# Patient Record
Sex: Male | Born: 1975 | Race: White | Hispanic: No | Marital: Married | Smoking: Current every day smoker
Health system: Southern US, Community
[De-identification: ages and names within clinical notes are randomized; demographics above are authoritative.]

## PROBLEM LIST (undated history)

## (undated) HISTORY — PX: EYE SURGERY: SHX253

## (undated) HISTORY — PX: KNEE SURGERY: SHX244

## (undated) HISTORY — PX: APPENDECTOMY: SHX54

---

## 2010-05-24 ENCOUNTER — Emergency Department (HOSPITAL_COMMUNITY): Admission: EM | Admit: 2010-05-24 | Discharge: 2010-05-24 | Payer: Self-pay | Admitting: Emergency Medicine

## 2010-10-02 ENCOUNTER — Emergency Department (HOSPITAL_COMMUNITY)
Admission: EM | Admit: 2010-10-02 | Discharge: 2010-10-02 | Disposition: A | Discharge status: Home / Self Care | Payer: Self-pay | Attending: Emergency Medicine | Admitting: Emergency Medicine

## 2010-10-02 DIAGNOSIS — K089 Disorder of teeth and supporting structures, unspecified: Secondary | ICD-10-CM | POA: Insufficient documentation

## 2010-10-20 ENCOUNTER — Emergency Department (HOSPITAL_COMMUNITY)
Admission: EM | Admit: 2010-10-20 | Discharge: 2010-10-20 | Disposition: A | Discharge status: Home / Self Care | Payer: Self-pay | Attending: Emergency Medicine | Admitting: Emergency Medicine

## 2010-10-20 DIAGNOSIS — K089 Disorder of teeth and supporting structures, unspecified: Secondary | ICD-10-CM | POA: Insufficient documentation

## 2011-08-06 ENCOUNTER — Emergency Department (HOSPITAL_COMMUNITY)
Admission: EM | Admit: 2011-08-06 | Discharge: 2011-08-07 | Disposition: A | Discharge status: Home / Self Care | Payer: Self-pay | Attending: Emergency Medicine | Admitting: Emergency Medicine

## 2011-08-06 DIAGNOSIS — X58XXXA Exposure to other specified factors, initial encounter: Secondary | ICD-10-CM | POA: Insufficient documentation

## 2011-08-06 DIAGNOSIS — S025XXA Fracture of tooth (traumatic), initial encounter for closed fracture: Secondary | ICD-10-CM | POA: Insufficient documentation

## 2011-08-06 DIAGNOSIS — K089 Disorder of teeth and supporting structures, unspecified: Secondary | ICD-10-CM | POA: Insufficient documentation

## 2011-08-06 DIAGNOSIS — K0889 Other specified disorders of teeth and supporting structures: Secondary | ICD-10-CM

## 2011-08-06 DIAGNOSIS — F17200 Nicotine dependence, unspecified, uncomplicated: Secondary | ICD-10-CM | POA: Insufficient documentation

## 2011-08-06 MED ORDER — HYDROCODONE-ACETAMINOPHEN 5-325 MG PO TABS
2.0000 | ORAL_TABLET | Freq: Once | ORAL | Status: AC
  Administered 2011-08-06: 2 via ORAL
  Filled 2011-08-06: qty 2

## 2011-08-06 MED ORDER — IBUPROFEN 800 MG PO TABS
800.0000 mg | ORAL_TABLET | Freq: Once | ORAL | Status: AC
  Administered 2011-08-06: 800 mg via ORAL
  Filled 2011-08-06: qty 1

## 2011-08-06 MED ORDER — PENICILLIN V POTASSIUM 250 MG PO TABS
500.0000 mg | ORAL_TABLET | Freq: Once | ORAL | Status: AC
  Administered 2011-08-06: 500 mg via ORAL
  Filled 2011-08-06: qty 2

## 2011-08-06 MED ORDER — HYDROCODONE-ACETAMINOPHEN 5-325 MG PO TABS: 1.0000 | ORAL_TABLET | ORAL | Status: AC | PRN

## 2011-08-06 MED ORDER — IBUPROFEN 800 MG PO TABS: 800.0000 mg | ORAL_TABLET | Freq: Three times a day (TID) | ORAL | Status: AC

## 2011-08-06 MED ORDER — PENICILLIN V POTASSIUM 500 MG PO TABS: 500.0000 mg | ORAL_TABLET | Freq: Four times a day (QID) | ORAL | Status: AC

## 2011-08-06 NOTE — ED Notes (Signed)
Pt reports having dental pain with his front teeth and left side of his jaw. Pt reports having some chipped teeth. Pt states that it hurts for him to talk, eat or drink anything. No swelling noted. Pt reports pain 8/10 at this time.

## 2011-08-06 NOTE — ED Provider Notes (Addendum)
History     CSN: [id number]  Arrival date & time 08/06/11  2250   First MD Initiated Contact with Patient 08/06/11 2313      Chief Complaint  Patient presents with  . Dental Pain    (Consider location/radiation/quality/duration/timing/severity/associated sxs/prior treatment) HPI Comments: Timothy Dougherty is a 35 y.o. male who presents to the Emergency Department complaining of dental pain which started today in teeth on the upper front of his mouth and the lower left. Teeth are broken at the gumline and have been for months. Today began hurting. He has taken tylenol without relief. He has an appointment with his dentist on 08/13/11. Denies fever, chills, difficulty swallowing.  Patient is a 35 y.o. male presenting with tooth pain.  Dental Pain   History reviewed. No pertinent past medical history.  Past Surgical History  Procedure Date  . Appendectomy     No family history on file.  History  Substance Use Topics  . Smoking status: Current Everyday Smoker    Types: Cigarettes  . Smokeless tobacco: Not on file  . Alcohol Use: No      Review of Systems  All other systems reviewed and are negative.    Allergies  Review of patient's allergies indicates no known allergies.  Home Medications  No current outpatient prescriptions on file.  BP 159/84  Pulse 98  Temp(Src) 98.6 F (37 C) (Oral)  Resp 22  Ht 6\' 3"  (1.905 m)  Wt 230 lb (104.327 kg)  BMI 28.75 kg/m2  SpO2 99%  Physical Exam  Nursing note and vitals reviewed. Constitutional: He is oriented to person, place, and time. He appears well-developed and well-nourished. No distress.  HENT:  Head: Normocephalic and atraumatic.  Right Ear: External ear normal.  Left Ear: External ear normal.  Mouth/Throat: Oropharynx is clear and moist.       Poor dental hygiene, poor dentition. Broken teeth 4 front on upper jaw, 1st and 2nd molar on bottom left. No obvious abscess, no facial swelling, no drainage.    Eyes: EOM are normal.  Cardiovascular: Normal rate, normal heart sounds and intact distal pulses.   Pulmonary/Chest: Breath sounds normal.  Musculoskeletal: Normal range of motion.  Neurological: He is alert and oriented to person, place, and time.  Skin: Skin is warm and dry.    ED Course  Procedures (including critical care time)     MDM  Patient with poor dentition here with dental pain. Given antibiotic, antiinflammatory, analgesic. Patient has follow up with his dentist on 08/13/11.Pt stable in ED with no significant deterioration in condition.The patient appears reasonably screened and/or stabilized for discharge and I doubt any other medical condition or other [facility] requiring further screening, evaluation, or treatment in the ED at this time prior to discharge.  MDM Reviewed: previous chart, nursing note and vitals           Nicoletta Dress. Colon Branch, MD 08/06/11 2346  Nicoletta Dress. Colon Branch, MD 08/07/11 0001

## 2011-08-06 NOTE — ED Notes (Signed)
Toothache that started today, top front

## 2013-12-11 ENCOUNTER — Emergency Department: Payer: Self-pay | Admitting: Emergency Medicine

## 2013-12-26 ENCOUNTER — Emergency Department: Payer: Self-pay | Admitting: Emergency Medicine

## 2015-01-11 IMAGING — CR DG HAND COMPLETE 3+V*L*
1 series · 3 of 3 positions shown · non-contrast
Comparison: None.

CLINICAL DATA: Left thumb pain after injury.

EXAM:
LEFT HAND - COMPLETE 3+ VIEW

[Series 1: x hand pa left · 0.14mm/px · 3 of 3 slices shown]
[im 1/3]
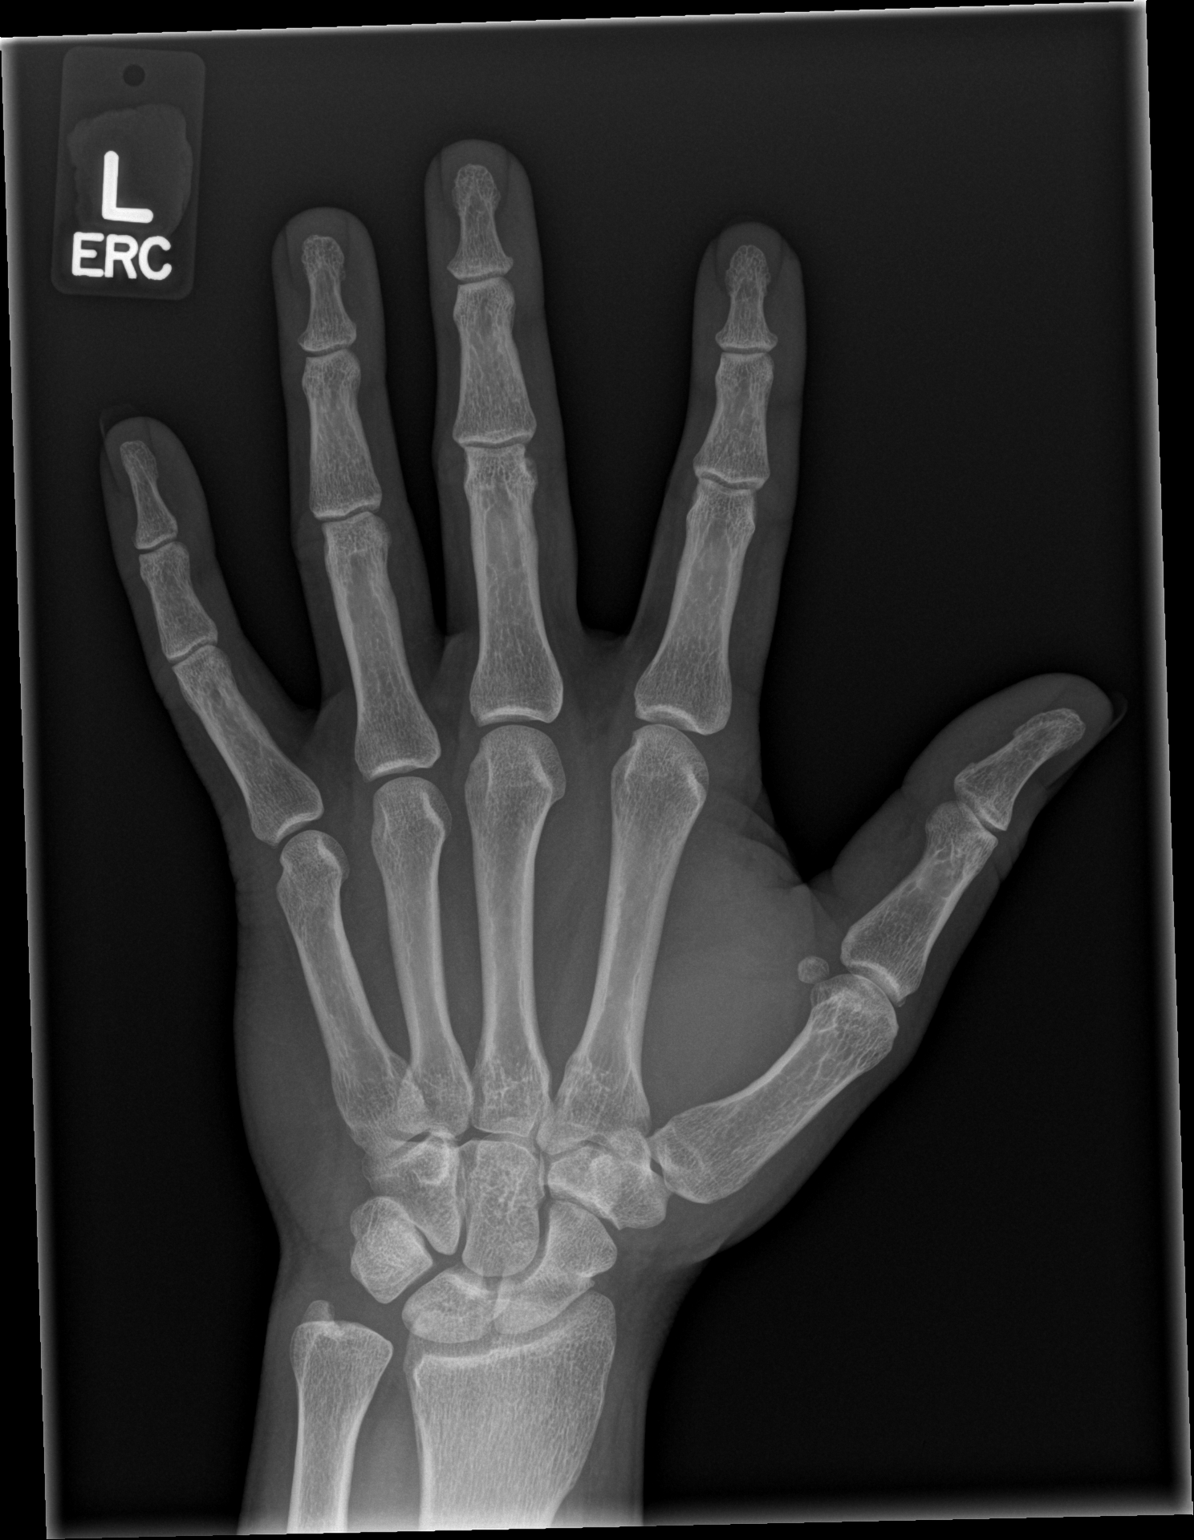
[im 2/3]
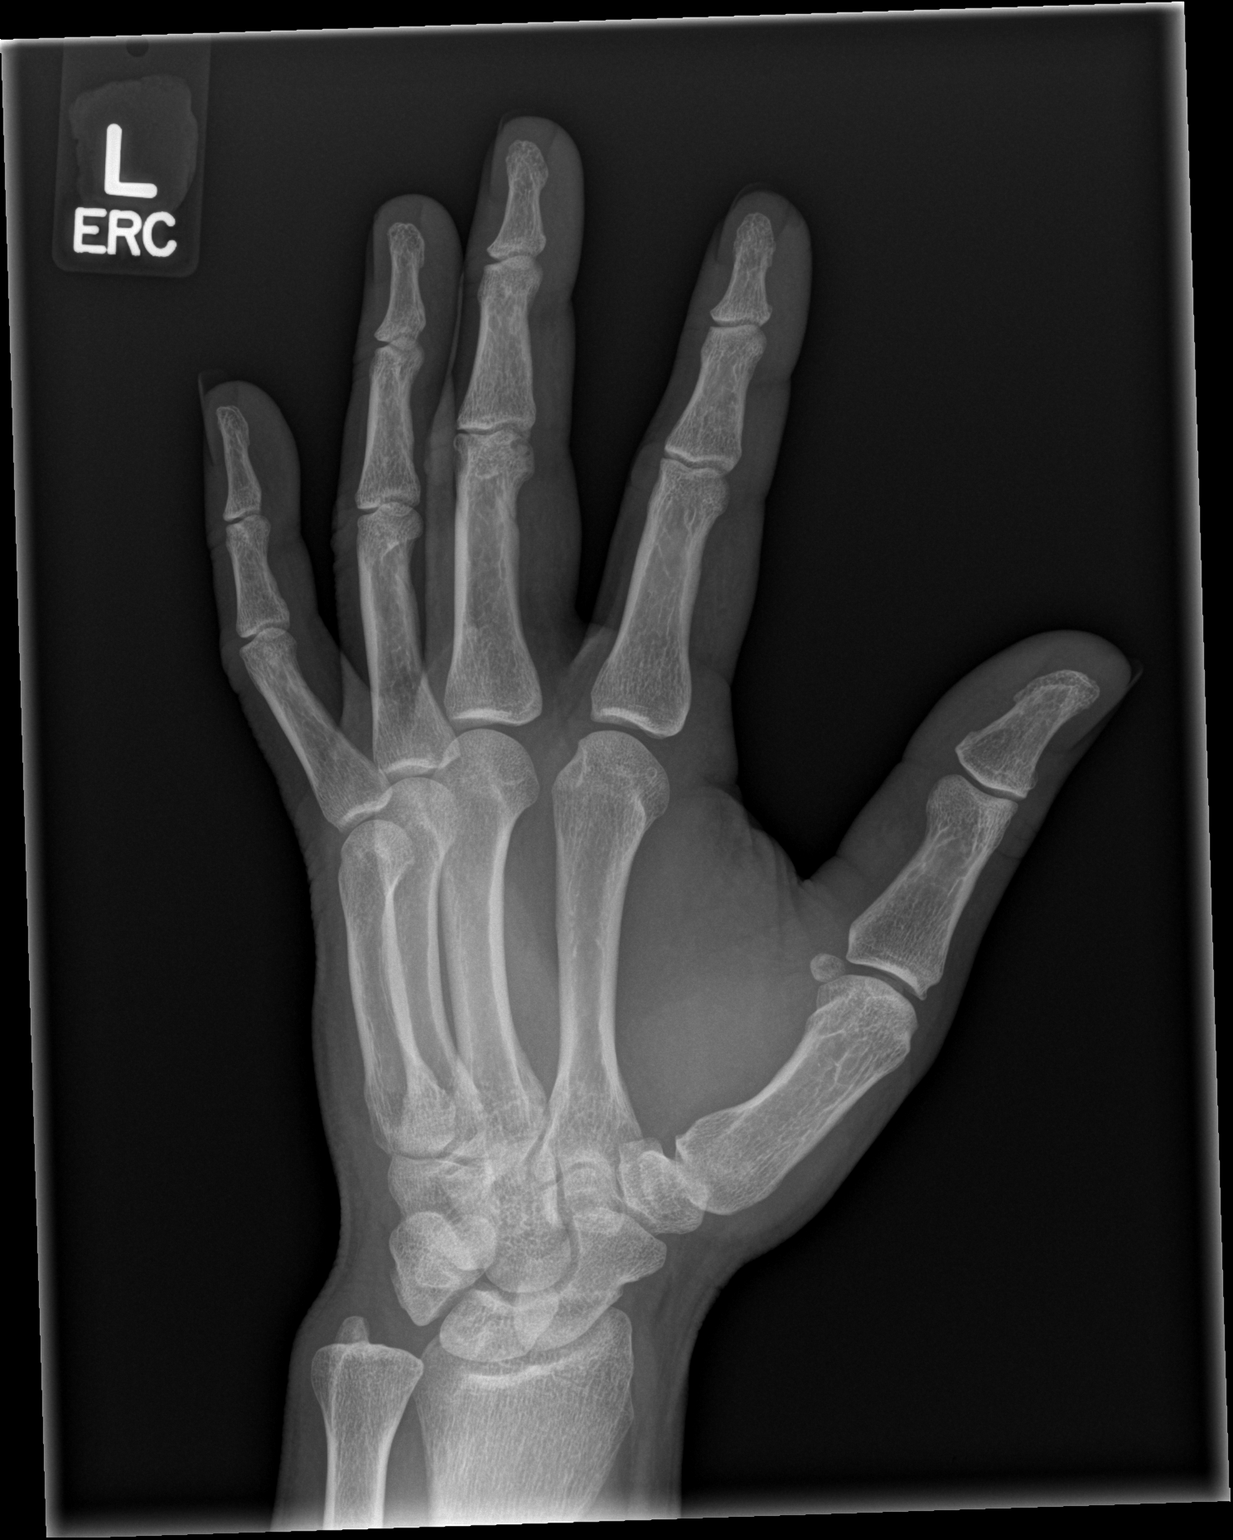
[im 3/3]
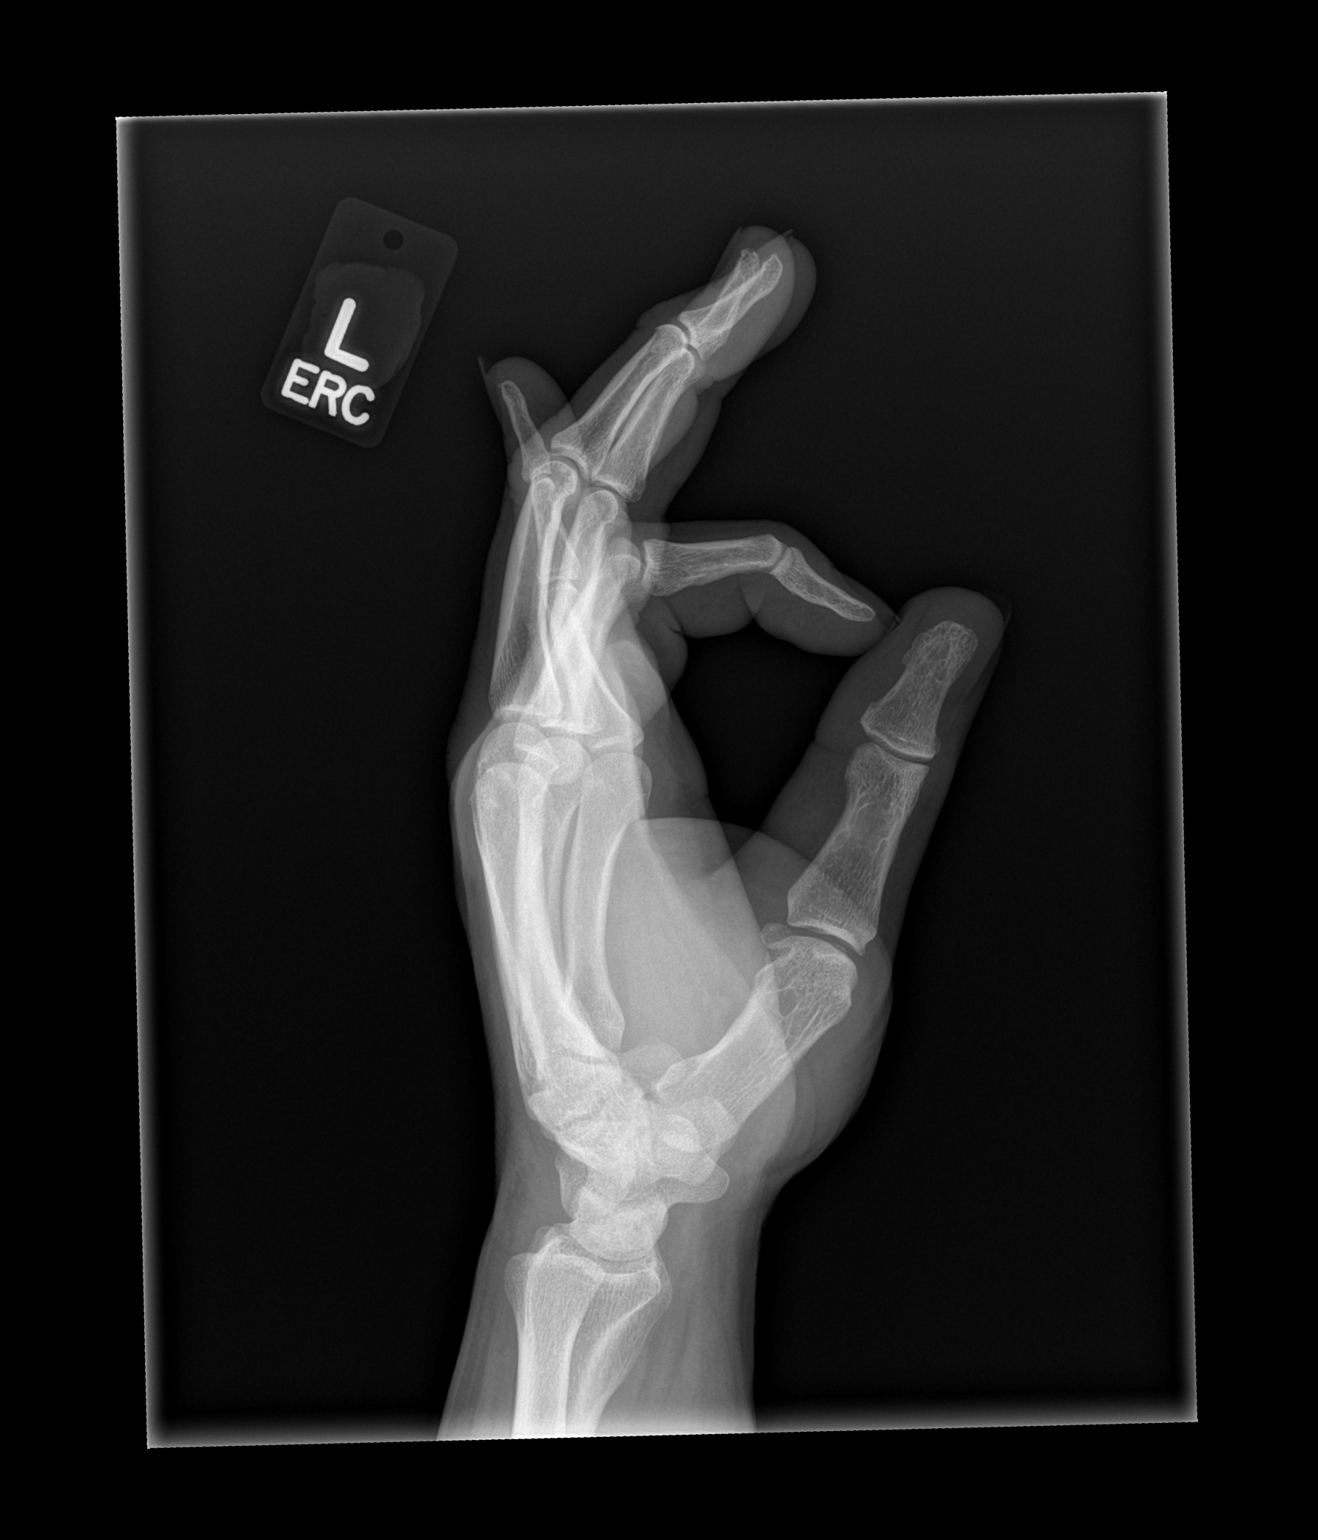

[3 of 3 positions shown; findings below may reference images not displayed]

FINDINGS: There is no evidence of fracture or dislocation. The thumb is
unremarkable in appearance. The joint spaces are preserved; the soft
tissues are unremarkable in appearance. The carpal rows are intact,
and demonstrate normal alignment.
IMPRESSION: No evidence of fracture or dislocation.

## 2015-01-26 IMAGING — CR LEFT WRIST - COMPLETE 3+ VIEW
1 series · 4 of 4 positions shown · non-contrast
Comparison: DG HAND COMPLETE 3+V*L* dated 12/11/2013

CLINICAL DATA: Pain between the first and second digits and radial
side of the wrist after injury. Limited range of motion.

EXAM:
LEFT WRIST - COMPLETE 3+ VIEW

[Series 1: pa · 0.17mm/px · 4 of 4 slices shown]
[im 1/4]
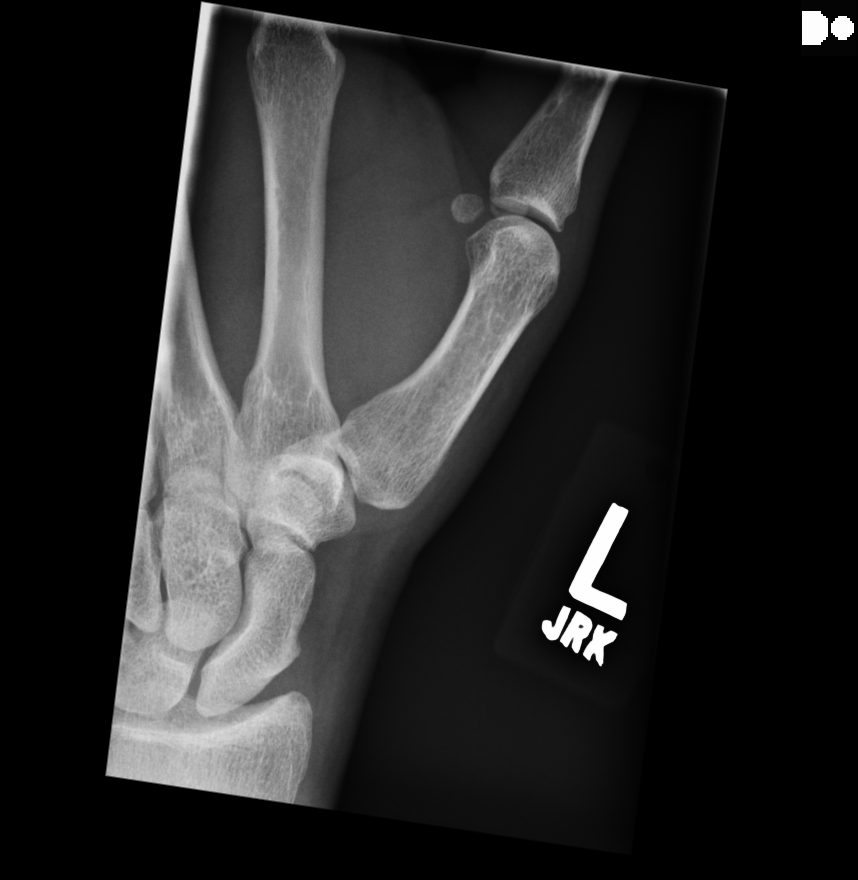
[im 2/4]
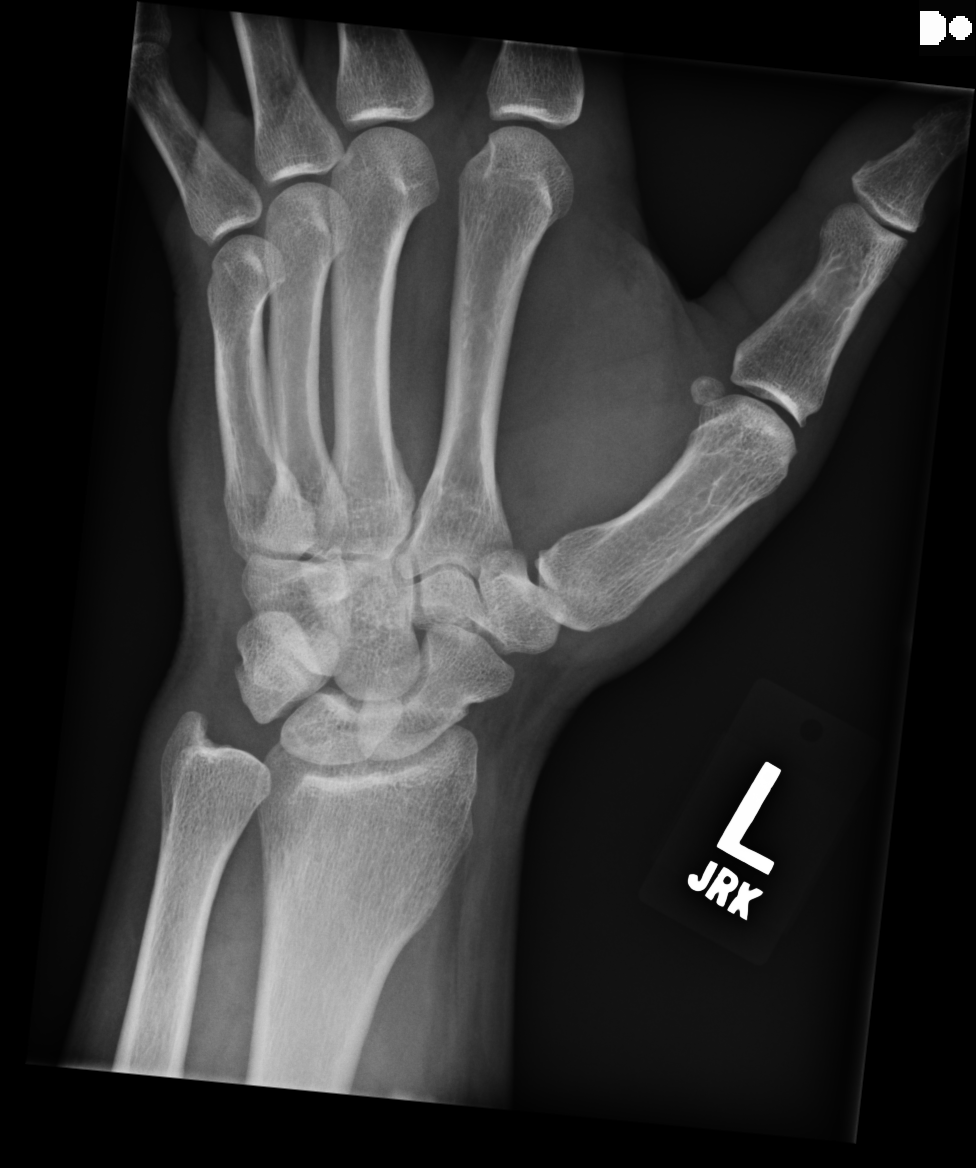
[im 3/4]
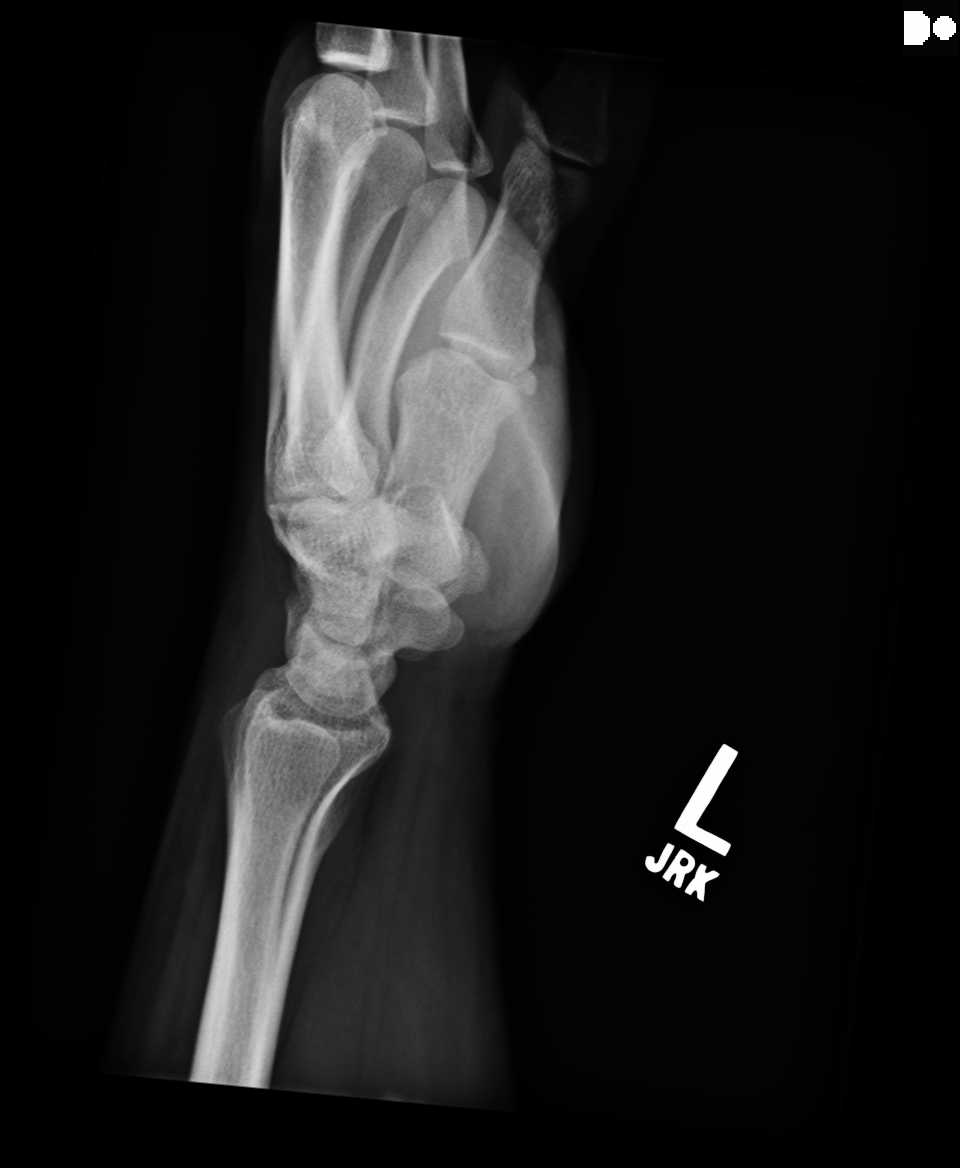
[im 4/4]
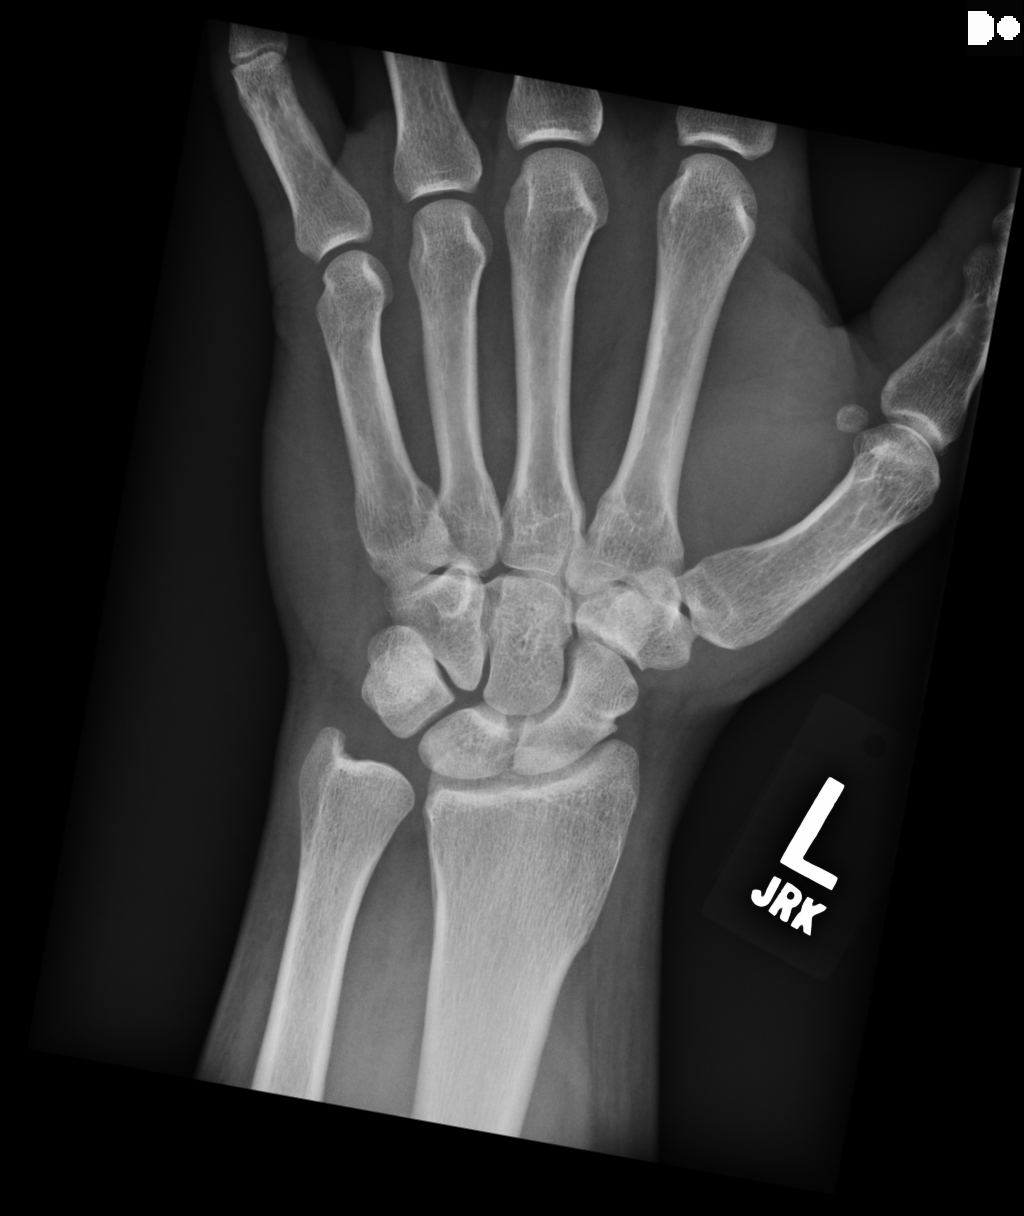

[4 of 4 positions shown; findings below may reference images not displayed]

FINDINGS: There is no evidence of fracture or dislocation. There is no
evidence of arthropathy or other focal bone abnormality. Soft
tissues are unremarkable.
IMPRESSION: Negative.

## 2015-12-31 ENCOUNTER — Encounter: Payer: Self-pay | Admitting: Emergency Medicine

## 2015-12-31 ENCOUNTER — Emergency Department
Admission: EM | Admit: 2015-12-31 | Discharge: 2015-12-31 | Disposition: A | Discharge status: Home / Self Care | Payer: Self-pay | Attending: Emergency Medicine | Admitting: Emergency Medicine

## 2015-12-31 DIAGNOSIS — F17210 Nicotine dependence, cigarettes, uncomplicated: Secondary | ICD-10-CM | POA: Insufficient documentation

## 2015-12-31 DIAGNOSIS — L0390 Cellulitis, unspecified: Secondary | ICD-10-CM

## 2015-12-31 DIAGNOSIS — L02416 Cutaneous abscess of left lower limb: Secondary | ICD-10-CM | POA: Insufficient documentation

## 2015-12-31 DIAGNOSIS — L0291 Cutaneous abscess, unspecified: Secondary | ICD-10-CM

## 2015-12-31 MED ORDER — CLINDAMYCIN HCL 150 MG PO CAPS
300.0000 mg | ORAL_CAPSULE | Freq: Once | ORAL | Status: AC
  Administered 2015-12-31: 300 mg via ORAL
  Filled 2015-12-31: qty 2

## 2015-12-31 MED ORDER — OXYCODONE-ACETAMINOPHEN 5-325 MG PO TABS
2.0000 | ORAL_TABLET | Freq: Once | ORAL | Status: AC
  Administered 2015-12-31: 2 via ORAL
  Filled 2015-12-31: qty 2

## 2015-12-31 MED ORDER — LIDOCAINE HCL (PF) 1 % IJ SOLN
5.0000 mL | Freq: Once | INTRAMUSCULAR | Status: AC
  Administered 2015-12-31: 5 mL via INTRADERMAL
  Filled 2015-12-31: qty 5

## 2015-12-31 MED ORDER — OXYCODONE-ACETAMINOPHEN 5-325 MG PO TABS: 1.0000 | ORAL_TABLET | Freq: Four times a day (QID) | ORAL | Status: AC | PRN

## 2015-12-31 MED ORDER — CLINDAMYCIN HCL 300 MG PO CAPS: 300.0000 mg | ORAL_CAPSULE | Freq: Three times a day (TID) | ORAL | Status: AC

## 2015-12-31 NOTE — ED Notes (Signed)
Patient ambulatory to triage with steady gait, without difficulty or distress noted; pt reports abscess to left inner thigh noted this morning

## 2015-12-31 NOTE — ED Provider Notes (Signed)
[facility] Emergency Department Provider Note   ____________________________________________  Time seen: Approximately 436 AM  I have reviewed the triage vital signs and the nursing notes.   HISTORY  Chief Complaint Abscess    HPI Timothy Dougherty is a 40 y.o. male who comes into the hospital today with a lump to his left upper thigh. The patient reports that it was a small bump yesterday but is significantly increased in size in the last 24 hours. The patient reports that it is painful ever since his it has gotten bigger. He's never had this of this size previously. He has been trying to deal with it and not taking any medications. The area has not been draining. The patient denies fevers but rates his pain a 6-7 out of 10 in intensity. He's had no nausea or vomiting, no chest pain. He tried warm compresses and sitting in a hot bath but it did not help the area to drain. The patient is here for evaluation and treatment of his abscess.   History reviewed. No pertinent past medical history.  There are no active problems to display for this patient.   Past Surgical History  Procedure Laterality Date  . Appendectomy      Current Outpatient Rx  Name  Route  Sig  Dispense  Refill  . clindamycin (CLEOCIN) 300 MG capsule   Oral   Take 1 capsule (300 mg total) by mouth 3 (three) times daily.   30 capsule   0   . oxyCODONE-acetaminophen (ROXICET) 5-325 MG tablet   Oral   Take 1 tablet by mouth every 6 (six) hours as needed.   12 tablet   0     Allergies Review of patient's allergies indicates no known allergies.  No family history on file.  Social History Social History  Substance Use Topics  . Smoking status: Current Every Day Smoker    Types: Cigarettes  . Smokeless tobacco: None  . Alcohol Use: No    Review of Systems Constitutional: No fever/chills Eyes: No visual changes. ENT: No sore throat. Cardiovascular: Denies chest  pain. Respiratory: Denies shortness of breath. Gastrointestinal: No abdominal pain.  No nausea, no vomiting.  No diarrhea.  No constipation. Genitourinary: Negative for dysuria. Musculoskeletal: Negative for back pain. Skin: Left inner thigh abscess Neurological: Negative for headaches, focal weakness or numbness.  10-point ROS otherwise negative.  ____________________________________________   PHYSICAL EXAM:  VITAL SIGNS: ED Triage Vitals  Enc Vitals Group     BP 12/31/15 0028 132/85 mmHg     Pulse Rate 12/31/15 0028 95     Resp 12/31/15 0028 20     Temp 12/31/15 0028 98.2 F (36.8 C)     Temp Source 12/31/15 0028 Oral     SpO2 12/31/15 0028 97 %     Weight 12/31/15 0028 295 lb (133.811 kg)     Height 12/31/15 0028  (1.905 m)     Head Cir --      Peak Flow --      Pain Score 12/31/15 0028 6     Pain Loc --      Pain Edu? --      Excl. in GC? --     Constitutional: Alert and oriented. Well appearing and in Mild distress. Eyes: Conjunctivae are normal. PERRL. EOMI. Head: Atraumatic. Nose: No congestion/rhinnorhea. Mouth/Throat: Mucous membranes are moist.  Oropharynx non-erythematous. Cardiovascular: Normal rate, regular rhythm. Grossly normal heart sounds.  Good peripheral circulation. Respiratory:  Normal respiratory effort.  No retractions. Lungs CTAB. Gastrointestinal: Soft and nontender. No distention. . Musculoskeletal: No lower extremity tenderness nor edema.  Neurologic:  Normal speech and language.  Skin:  Abscess with fluctuance, induration and erythema to left upper inner thigh. Psychiatric: Mood and affect are normal.   ____________________________________________   LABS (all labs ordered are listed, but only abnormal results are displayed)  Labs Reviewed - No data to  display ____________________________________________  EKG  none ____________________________________________  RADIOLOGY  none ____________________________________________   PROCEDURES  Procedure(s) performed: please, see procedure note(s).   INCISION AND DRAINAGE Performed by: Lucrezia EuropeWebster,  Pedram Goodchild P Consent: Verbal consent obtained. Risks and benefits: risks, benefits and alternatives were discussed Type: abscess  Body area: Left inner thigh  Anesthesia: local infiltration  Incision was made with a scalpel.  Local anesthetic: lidocaine 1% without epinephrine  Anesthetic total: 3 ml  Complexity: complex Blunt dissection to break up loculations  Drainage: bloody  Drainage amount: moderate  Packing material: 1/4 in iodoform gauze  Patient tolerance: Patient tolerated the procedure well with no immediate complications.     Critical Care performed: No  ____________________________________________   INITIAL IMPRESSION / ASSESSMENT AND PLAN / ED COURSE  Pertinent labs & imaging results that were available during my care of the patient were reviewed by me and considered in my medical decision making (see chart for details).  This is a 40 year old male who comes into the hospital today with an abscess to his left inner thigh. The patient is complaining of some significant pain. I did give the patient a dose of Percocet and I did incise and drain the area. There was initially some clear appearing fluid within some bloody drainage afterwards. I was able to remove significant amounts of fluid from the patient's left thigh. I did improve for loculations and packed area. The patient was having some improvement in the pressure and discomfort he had been having previously. The patient will be discharged to home with some clindamycin which he received here in the ER as well as pain medication. ____________________________________________   FINAL CLINICAL IMPRESSION(S) / ED  DIAGNOSES  Final diagnoses:  Abscess and cellulitis      NEW MEDICATIONS STARTED DURING THIS VISIT:  Discharge Medication List as of 12/31/2015  5:54 AM    START taking these medications   Details  clindamycin (CLEOCIN) 300 MG capsule Take 1 capsule (300 mg total) by mouth 3 (three) times daily., Starting 12/31/2015, Until Discontinued, Print    oxyCODONE-acetaminophen (ROXICET) 5-325 MG tablet Take 1 tablet by mouth every 6 (six) hours as needed., Starting 12/31/2015, Until Discontinued, Print         Note:  This document was prepared using Dragon voice recognition software and may include unintentional dictation errors.    Rebecka ApleyAllison P Kimarie Coor, MD 12/31/15 [phone]

## 2015-12-31 NOTE — ED Notes (Signed)
PT REPORTS HE HAS ABSCESS ON LEFT UPPER INNER THIGH - ABSCESS HAS INCREASED IN SIZE OVER THE LAST 24 HOURS AND INCREASED IN PAIN LEVEL - PT DENIES DRAINAGE

## 2015-12-31 NOTE — Discharge Instructions (Signed)

## 2016-02-28 ENCOUNTER — Emergency Department: Payer: Self-pay

## 2016-02-28 ENCOUNTER — Emergency Department
Admission: EM | Admit: 2016-02-28 | Discharge: 2016-02-28 | Disposition: A | Discharge status: Home / Self Care | Payer: Self-pay | Attending: Emergency Medicine | Admitting: Emergency Medicine

## 2016-02-28 ENCOUNTER — Encounter: Payer: Self-pay | Admitting: Urgent Care

## 2016-02-28 DIAGNOSIS — S39012A Strain of muscle, fascia and tendon of lower back, initial encounter: Secondary | ICD-10-CM | POA: Insufficient documentation

## 2016-02-28 DIAGNOSIS — Y939 Activity, unspecified: Secondary | ICD-10-CM | POA: Insufficient documentation

## 2016-02-28 DIAGNOSIS — S63614A Unspecified sprain of right ring finger, initial encounter: Secondary | ICD-10-CM | POA: Insufficient documentation

## 2016-02-28 DIAGNOSIS — F17210 Nicotine dependence, cigarettes, uncomplicated: Secondary | ICD-10-CM | POA: Insufficient documentation

## 2016-02-28 DIAGNOSIS — X501XXA Overexertion from prolonged static or awkward postures, initial encounter: Secondary | ICD-10-CM | POA: Insufficient documentation

## 2016-02-28 DIAGNOSIS — Y999 Unspecified external cause status: Secondary | ICD-10-CM | POA: Insufficient documentation

## 2016-02-28 DIAGNOSIS — Y929 Unspecified place or not applicable: Secondary | ICD-10-CM | POA: Insufficient documentation

## 2016-02-28 MED ORDER — HYDROCODONE-ACETAMINOPHEN 5-325 MG PO TABS: 1.0000 | ORAL_TABLET | ORAL | Status: AC | PRN

## 2016-02-28 MED ORDER — HYDROCODONE-ACETAMINOPHEN 5-325 MG PO TABS
2.0000 | ORAL_TABLET | Freq: Once | ORAL | Status: AC
  Administered 2016-02-28: 2 via ORAL
  Filled 2016-02-28: qty 2

## 2016-02-28 NOTE — ED Notes (Signed)

## 2016-02-28 NOTE — ED Notes (Signed)
Patient presents with c/o lower back pain and RIGHT hand pain s/p moving a refrigerator. Patient reports limited and painful ROM in RIGHT hand; mostly over 4th digit and wrist.

## 2016-02-28 NOTE — ED Provider Notes (Signed)
[facility] Emergency Department Provider Note  ____________________________________________  Time seen: Approximately 2:52 AM  I have reviewed the triage vital signs and the nursing notes.   HISTORY  Chief Complaint Back Pain and Hand Injury    HPI Timothy Dougherty is a [age] y.o. male with no significant past medical history who presents for evaluation of acute onset sharp stabbing severe pain in his lower back and in his right hand, specifically the right ring finger.  He was moving a refrigerator when the person that was helping him dropped his side and it created a heavy stress on the patient's back and hand.  Of note he is left-hand dominant.  The patient reports that he felt a popping sensation in his right ring finger and that the pain is radiating from the finger into his hand.  Similarly he felt a popping sensation in his lower back and has some pain in his or spine area.  He walks with a limp.  He has no decreased sensation, no weakness, no numbness nor tingling, no urinary retention, no incontinence of urine.  He denies fever/chills, chest pain, shortness of breath, nausea, vomiting, abdominal pain.  Movement makes the pain in both sides worse and holding still makes it better.   History reviewed. No pertinent past medical history.  There are no active problems to display for this patient.   Past Surgical History  Procedure Laterality Date  . Appendectomy    . Eye surgery    . Knee surgery Left     Current Outpatient Rx  Name  Route  Sig  Dispense  Refill  . clindamycin (CLEOCIN) 300 MG capsule   Oral   Take 1 capsule (300 mg total) by mouth 3 (three) times daily.   30 capsule   0   . HYDROcodone-acetaminophen (NORCO/VICODIN) 5-325 MG tablet   Oral   Take 1-2 tablets by mouth every 4 (four) hours as needed for moderate pain.   10 tablet   0   . oxyCODONE-acetaminophen (ROXICET) 5-325 MG tablet   Oral   Take 1 tablet by mouth every  6 (six) hours as needed.   12 tablet   0     Allergies Review of patient's allergies indicates no known allergies.  No family history on file.  Social History Social History  Substance Use Topics  . Smoking status: Current Every Day Smoker    Types: Cigarettes  . Smokeless tobacco: None  . Alcohol Use: No    Review of Systems Constitutional: No fever/chills Eyes: No visual changes. ENT: No sore throat. Cardiovascular: Denies chest pain. Respiratory: Denies shortness of breath. Gastrointestinal: No abdominal pain.  No nausea, no vomiting.  No diarrhea.  No constipation. Genitourinary: Negative for dysuria. Musculoskeletal: low back pain and pain in right ring finger and right hand Skin: Negative for rash. Neurological: Negative for headaches, focal weakness or numbness.  10-point ROS otherwise negative.  ____________________________________________   PHYSICAL EXAM:  VITAL SIGNS: ED Triage Vitals  Enc Vitals Group     BP 02/28/16 0050 133/92 mmHg     Pulse Rate 02/28/16 0050 107     Resp 02/28/16 0050 20     Temp 02/28/16 0050 97.8 F (36.6 C)     Temp Source 02/28/16 0050 Oral     SpO2 02/28/16 0050 98 %     Weight 02/28/16 0050 283 lb (128.368 kg)     Height 02/28/16 0050 6\' 3"  (1.905 m)  Head Cir --      Peak Flow --      Pain Score 02/28/16 0050 7     Pain Loc --      Pain Edu? --      Excl. in GC? --     Constitutional: Alert and oriented. Well appearing and in no acute distress.  Disheveled appearance. Eyes: Conjunctivae are normal. PERRL. EOMI. Head: Atraumatic. Cardiovascular: Normal rate, regular rhythm. Good peripheral circulation. Grossly normal heart sounds.   Respiratory: Normal respiratory effort.  No retractions. Lungs CTAB. Gastrointestinal: Soft and nontender. No distention.  Musculoskeletal: Paraspinal tenderness to palpation of the lumbar spine, no stepoffs nor deformities nor bony tenderness.  No visible injury.  Tenderness to  palpation and with attempted movement of the right ring finger.  Specifically he has pain with flexion and extension of MCP, PIP, and DIP, although most of the pain seems associated with the PIP.  No snuffbox tenderness in the right hand.  Mild swelling around the 4th PIP.  No ecchymosis.  Normal cap refill, N/V intact. Neurologic:  Normal speech and language. No gross focal neurologic deficits are appreciated.  Skin:  Skin is warm, dry and intact. No rash noted. Psychiatric: Mood and affect are normal. Speech and behavior are normal.  ____________________________________________   LABS (all labs ordered are listed, but only abnormal results are displayed)  Labs Reviewed - No data to display ____________________________________________  EKG   ____________________________________________  RADIOLOGY   Dg Lumbar Spine 2-3 Views  02/28/2016  CLINICAL DATA:  40 year old male with fall and back pain. EXAM: LUMBAR SPINE - 2-3 VIEW COMPARISON:  None. FINDINGS: There is no acute fracture or subluxation of the lumbar spine. There are multilevel degenerative changes with multilevel mild loss of vertebral body height and endplate irregularity which appear chronic. The visualized transverse and spinous processes appear intact. The soft tissues appear unremarkable. Multiple surgical clips noted in the right lower quadrant. IMPRESSION: No acute fracture or subluxation. Electronically Signed   By: Elgie Collard M.D.   On: 02/28/2016 01:49   Dg Hand Complete Right  02/28/2016  CLINICAL DATA:  Right hand pain after moving a refrigerator. Limited range of motion. EXAM: RIGHT HAND - COMPLETE 3+ VIEW COMPARISON:  None. FINDINGS: There is no evidence of fracture or dislocation. There is no evidence of arthropathy or other focal bone abnormality. Soft tissues are unremarkable. IMPRESSION: Negative. Electronically Signed   By: Burman Nieves M.D.   On: 02/28/2016 01:31     ____________________________________________   PROCEDURES  Procedure(s) performed:   .Splint Application Date/Time: 02/28/2016 3:08 AM Performed by: Loleta Rose Authorized by: Loleta Rose Consent: Verbal consent obtained. Consent given by: patient Imaging studies: imaging studies available Patient identity confirmed: verbally with patient Location details: right ring finger Splint type: static finger Supplies used: aluminum splint and elastic bandage Post-procedure: The splinted body part was neurovascularly unchanged following the procedure. Patient tolerance: Patient tolerated the procedure well with no immediate complications     ____________________________________________   INITIAL IMPRESSION / ASSESSMENT AND PLAN / ED COURSE  Pertinent labs & imaging results that were available during my care of the patient were reviewed by me and considered in my medical decision making (see chart for details).  Muscle strain in lower back, and suspect finger/hand sprain in right.  Specially I suspect collateral ligament damage of the right ring finger mostly around the PIP although he does have some tenderness around the MCP as well.  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  I encouraged the patient to rest the hand and follow-up with orthopedics within a week for reassessment and possibly reimaging.  I reviewed the patient's prescription history over the last 12 months in the [place] Controlled Substances Database and he has only had 1 prescription of narcotics filled in that time period and that was when he had a large abscess drained on his leg.  I will give him a very few number of Norco and I encouraged the use of NSAIDs as a primary treatment.  I gave him extra supplies for splinting.  I gave my usual and customary return  precautions.   ____________________________________________  FINAL CLINICAL IMPRESSION(S) / ED DIAGNOSES  Final diagnoses:  Sprain of right ring finger, initial encounter  Lumbar spine strain, initial encounter     MEDICATIONS GIVEN DURING THIS VISIT:  Medications  HYDROcodone-acetaminophen (NORCO/VICODIN) 5-325 MG per tablet 2 tablet (not administered)     NEW OUTPATIENT MEDICATIONS STARTED DURING THIS VISIT:  New Prescriptions   HYDROCODONE-ACETAMINOPHEN (NORCO/VICODIN) 5-325 MG TABLET    Take 1-2 tablets by mouth every 4 (four) hours as needed for moderate pain.      Note:  This document was prepared using Dragon voice recognition software and may include unintentional dictation errors.   Loleta Rose, MD 02/28/16 [phone]

## 2016-02-28 NOTE — Discharge Instructions (Signed)
Finger Sprain A finger sprain is a tear in one of the strong, fibrous tissues that connect the bones (ligaments) in your finger. The severity of the sprain depends on how much of the ligament is torn. The tear can be either partial or complete. CAUSES  Often, sprains are a result of a fall or accident. If you extend your hands to catch an object or to protect yourself, the force of the impact causes the fibers of your ligament to stretch too much. This excess tension causes the fibers of your ligament to tear. SYMPTOMS  You may have some loss of motion in your finger. Other symptoms include:  Bruising.  Tenderness.  Swelling. DIAGNOSIS  In order to diagnose finger sprain, your caregiver will physically examine your finger or thumb to determine how torn the ligament is. Your caregiver may also suggest an X-ray exam of your finger to make sure no bones are broken. TREATMENT  If your ligament is only partially torn, treatment usually involves keeping the finger in a fixed position (immobilization) for a short period. To do this, your caregiver will apply a bandage, cast, or splint to keep your finger from moving until it heals. For a partially torn ligament, the healing process usually takes 2 to 3 weeks. If your ligament is completely torn, you may need surgery to reconnect the ligament to the bone. After surgery a cast or splint will be applied and will need to stay on your finger or thumb for 4 to 6 weeks while your ligament heals. HOME CARE INSTRUCTIONS  Keep your injured finger elevated, when possible, to decrease swelling.  To ease pain and swelling, apply ice to your joint twice a day, for 2 to 3 days:  Put ice in a plastic bag.  Place a towel between your skin and the bag.  Leave the ice on for 15 minutes.  Only take over-the-counter or prescription medicine for pain as directed by your caregiver.  Do not wear rings on your injured finger.  Do not leave your finger unprotected  until pain and stiffness go away (usually 3 to 4 weeks).  Do not allow your cast or splint to get wet. Cover your cast or splint with a plastic bag when you shower or bathe. Do not swim.  Your caregiver may suggest special exercises for you to do during your recovery to prevent or limit permanent stiffness. SEEK IMMEDIATE MEDICAL CARE IF:  Your cast or splint becomes damaged.  Your pain becomes worse rather than better. MAKE SURE YOU:  Understand these instructions.  Will watch your condition.  Will get help right away if you are not doing well or get worse.   This information is not intended to replace advice given to you by your health care provider. Make sure you discuss any questions you have with your health care provider.   Document Released: 09/10/2004 Document Revised: 08/24/2014 Document Reviewed: 04/06/2011 Elsevier Interactive Patient Education 2016 [employer]   Lumbosacral Strain Lumbosacral strain is a strain of any of the parts that make up your lumbosacral vertebrae. Your lumbosacral vertebrae are the bones that make up the lower third of your backbone. Your lumbosacral vertebrae are held together by muscles and tough, fibrous tissue (ligaments).  CAUSES  A sudden blow to your back can cause lumbosacral strain. Also, anything that causes an excessive stretch of the muscles in the low back can cause this strain. This is typically seen when people exert themselves strenuously, fall, lift heavy objects, bend,  or crouch repeatedly. RISK FACTORS  Physically demanding work.  Participation in pushing or pulling sports or sports that require a sudden twist of the back (tennis, golf, baseball).  Weight lifting.  Excessive lower back curvature.  Forward-tilted pelvis.  Weak back or abdominal muscles or both.  Tight hamstrings. SIGNS AND SYMPTOMS  Lumbosacral strain may cause pain in the area of your injury or pain that moves (radiates) down your leg.   DIAGNOSIS Your health care provider can often diagnose lumbosacral strain through a physical exam. In some cases, you may need tests such as X-ray exams.  TREATMENT  Treatment for your lower back injury depends on many factors that your clinician will have to evaluate. However, most treatment will include the use of anti-inflammatory medicines. HOME CARE INSTRUCTIONS   Avoid hard physical activities (tennis, racquetball, waterskiing) if you are not in proper physical condition for it. This may aggravate or create problems.  If you have a back problem, avoid sports requiring sudden body movements. Swimming and walking are generally safer activities.  Maintain good posture.  Maintain a healthy weight.  For acute conditions, you may put ice on the injured area.  Put ice in a plastic bag.  Place a towel between your skin and the bag.  Leave the ice on for 20 minutes, 2-3 times a day.  When the low back starts healing, stretching and strengthening exercises may be recommended. SEEK MEDICAL CARE IF:  Your back pain is getting worse.  You experience severe back pain not relieved with medicines. SEEK IMMEDIATE MEDICAL CARE IF:   You have numbness, tingling, weakness, or problems with the use of your arms or legs.  There is a change in bowel or bladder control.  You have increasing pain in any area of the body, including your belly (abdomen).  You notice shortness of breath, dizziness, or feel faint.  You feel sick to your stomach (nauseous), are throwing up (vomiting), or become sweaty.  You notice discoloration of your toes or legs, or your feet get very cold. MAKE SURE YOU:   Understand these instructions.  Will watch your condition.  Will get help right away if you are not doing well or get worse.   This information is not intended to replace advice given to you by your health care provider. Make sure you discuss any questions you have with your health care provider.    Document Released: 05/13/2005 Document Revised: 08/24/2014 Document Reviewed: 03/22/2013 Elsevier Interactive Patient Education 2016 [place] Injury Prevention Back injuries can be very painful. They can also be difficult to heal. After having one back injury, you are more likely to injure your back again. It is important to learn how to avoid injuring or re-injuring your back. The following tips can help you to prevent a back injury. WHAT SHOULD I KNOW ABOUT PHYSICAL FITNESS?  Exercise for 30 minutes per day on most days of the week or as directed by your health care provider. Make sure to:  Do aerobic exercises, such as walking, jogging, biking, or swimming.  Do exercises that increase balance and strength, such as tai chi and yoga. These can decrease your risk of falling and injuring your back.  Do stretching exercises to help with flexibility.  Try to develop strong abdominal muscles. Your abdominal muscles provide a lot of the support that is needed by your back.  Maintain a healthy weight. This helps to decrease your risk of a back injury. WHAT SHOULD I  KNOW ABOUT MY DIET?  Talk with your health care provider about your overall diet. Take supplements and vitamins only as directed by your health care provider.  Talk with your health care provider about how much calcium and vitamin D you need each day. These nutrients help to prevent weakening of the bones (osteoporosis). Osteoporosis can cause broken (fractured) bones, which lead to back pain.  Include good sources of calcium in your diet, such as dairy products, green leafy vegetables, and products that have had calcium added to them (fortified).  Include good sources of vitamin D in your diet, such as milk and foods that are fortified with vitamin D. WHAT SHOULD I KNOW ABOUT MY POSTURE?  Sit up straight and stand up straight. Avoid leaning forward when you sit or hunching over when you stand.  Choose chairs that  have good low-back (lumbar) support.  If you work at a desk, sit close to it so you do not need to lean over. Keep your chin tucked in. Keep your neck drawn back, and keep your elbows bent at a right angle. Your arms should look like the letter "L."  Sit high and close to the steering wheel when you drive. Add a lumbar support to your car seat, if needed.  Avoid sitting or standing in one position for very long. Take breaks to get up, stretch, and walk around at least one time every hour. Take breaks every hour if you are driving for long periods of time.  Sleep on your side with your knees slightly bent, or sleep on your back with a pillow under your knees. Do not lie on the front of your body to sleep. WHAT SHOULD I KNOW ABOUT LIFTING, TWISTING, AND REACHING? Lifting and Heavy Lifting  Avoid heavy lifting, especially repetitive heavy lifting. If you must do heavy lifting:  Stretch before lifting.  Work slowly.  Rest between lifts.  Use a tool such as a cart or a dolly to move objects if one is available.  Make several small trips instead of carrying one heavy load.  Ask for help when you need it, especially when moving big objects.  Follow these steps when lifting:  Stand with your feet shoulder-width apart.  Get as close to the object as you can. Do not try to pick up a heavy object that is far from your body.  Use handles or lifting straps if they are available.  Bend at your knees. Squat down, but keep your heels off the floor.  Keep your shoulders pulled back, your chin tucked in, and your back straight.  Lift the object slowly while you tighten the muscles in your legs, abdomen, and buttocks. Keep the object as close to the center of your body as possible.  Follow these steps when putting down a heavy load:  Stand with your feet shoulder-width apart.  Lower the object slowly while you tighten the muscles in your legs, abdomen, and buttocks. Keep the object as close  to the center of your body as possible.  Keep your shoulders pulled back, your chin tucked in, and your back straight.  Bend at your knees. Squat down, but keep your heels off the floor.  Use handles or lifting straps if they are available. Twisting and Reaching  Avoid lifting heavy objects above your waist.  Do not twist at your waist while you are lifting or carrying a load. If you need to turn, move your feet.  Do not bend over without bending  at your knees.  Avoid reaching over your head, across a table, or for an object on a high surface. WHAT ARE SOME OTHER TIPS?  Avoid wet floors and icy ground. Keep sidewalks clear of ice to prevent falls.  Do not sleep on a mattress that is too soft or too hard.  Keep items that are used frequently within easy reach.  Put heavier objects on shelves at waist level, and put lighter objects on lower or higher shelves.  Find ways to decrease your stress, such as exercise, massage, or relaxation techniques. Stress can build up in your muscles. Tense muscles are more vulnerable to injury.  Talk with your health care provider if you feel anxious or depressed. These conditions can make back pain worse.  Wear flat heel shoes with cushioned soles.  Avoid sudden movements.  Use both shoulder straps when carrying a backpack.  Do not use any tobacco products, including cigarettes, chewing tobacco, or electronic cigarettes. If you need help quitting, ask your health care provider.   This information is not intended to replace advice given to you by your health care provider. Make sure you discuss any questions you have with your health care provider.   Document Released: 09/10/2004 Document Revised: 12/18/2014 Document Reviewed: 08/07/2014 Elsevier Interactive Patient Education [employer].

## 2017-03-30 IMAGING — CR DG LUMBAR SPINE 2-3V
1 series · 3 of 3 positions shown · non-contrast
Comparison: None.

CLINICAL DATA: 40-year-old male with fall and back pain.

EXAM:
LUMBAR SPINE - 2-3 VIEW

[Series 1: dg lumbar spine 2-3 views · 0.14mm/px · 3 of 3 slices shown]
[im 1/3]
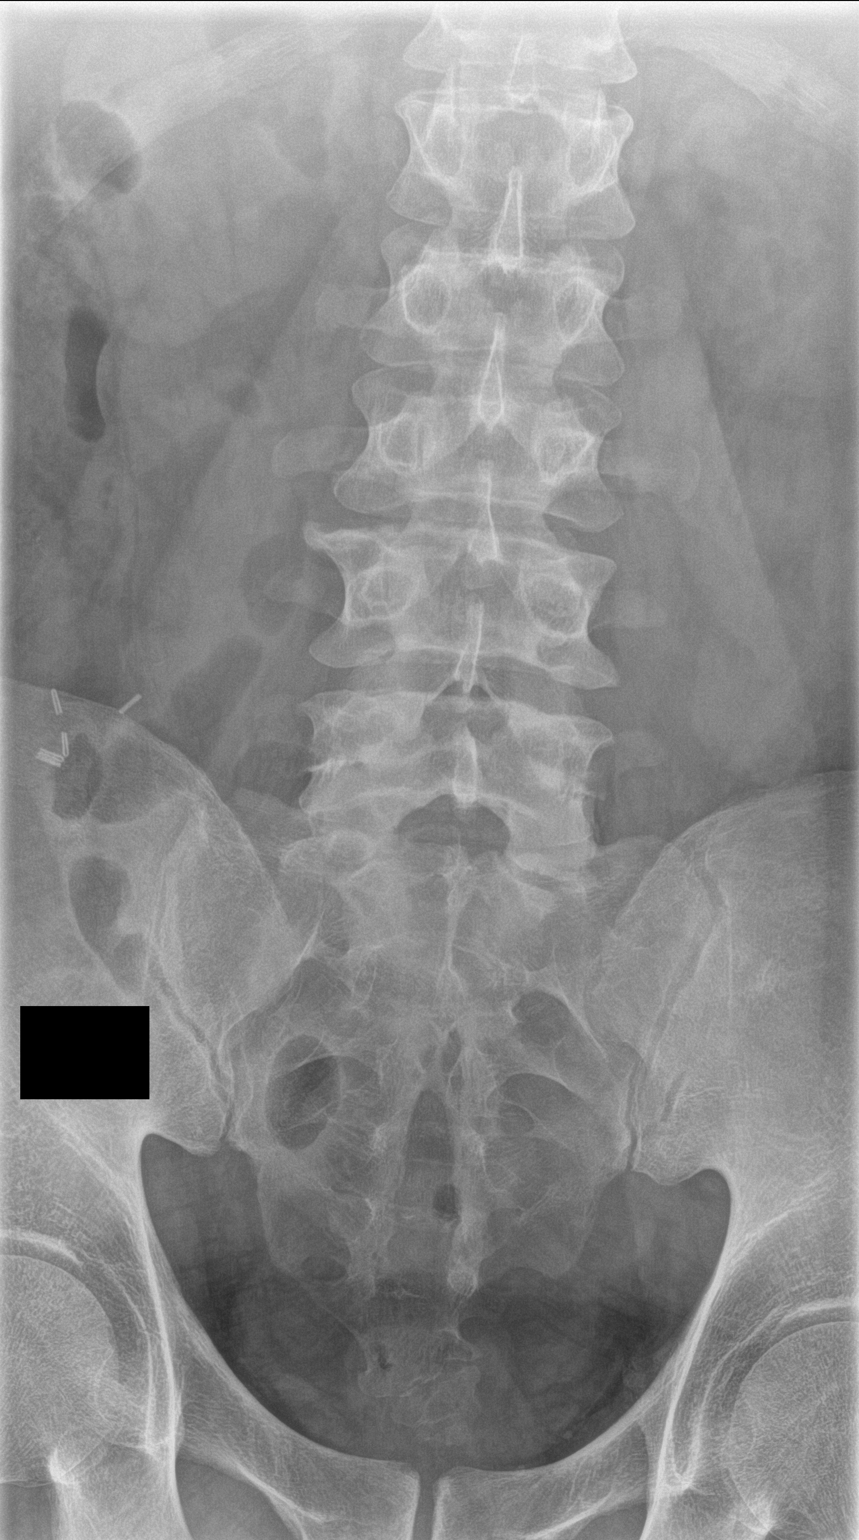
[im 2/3]
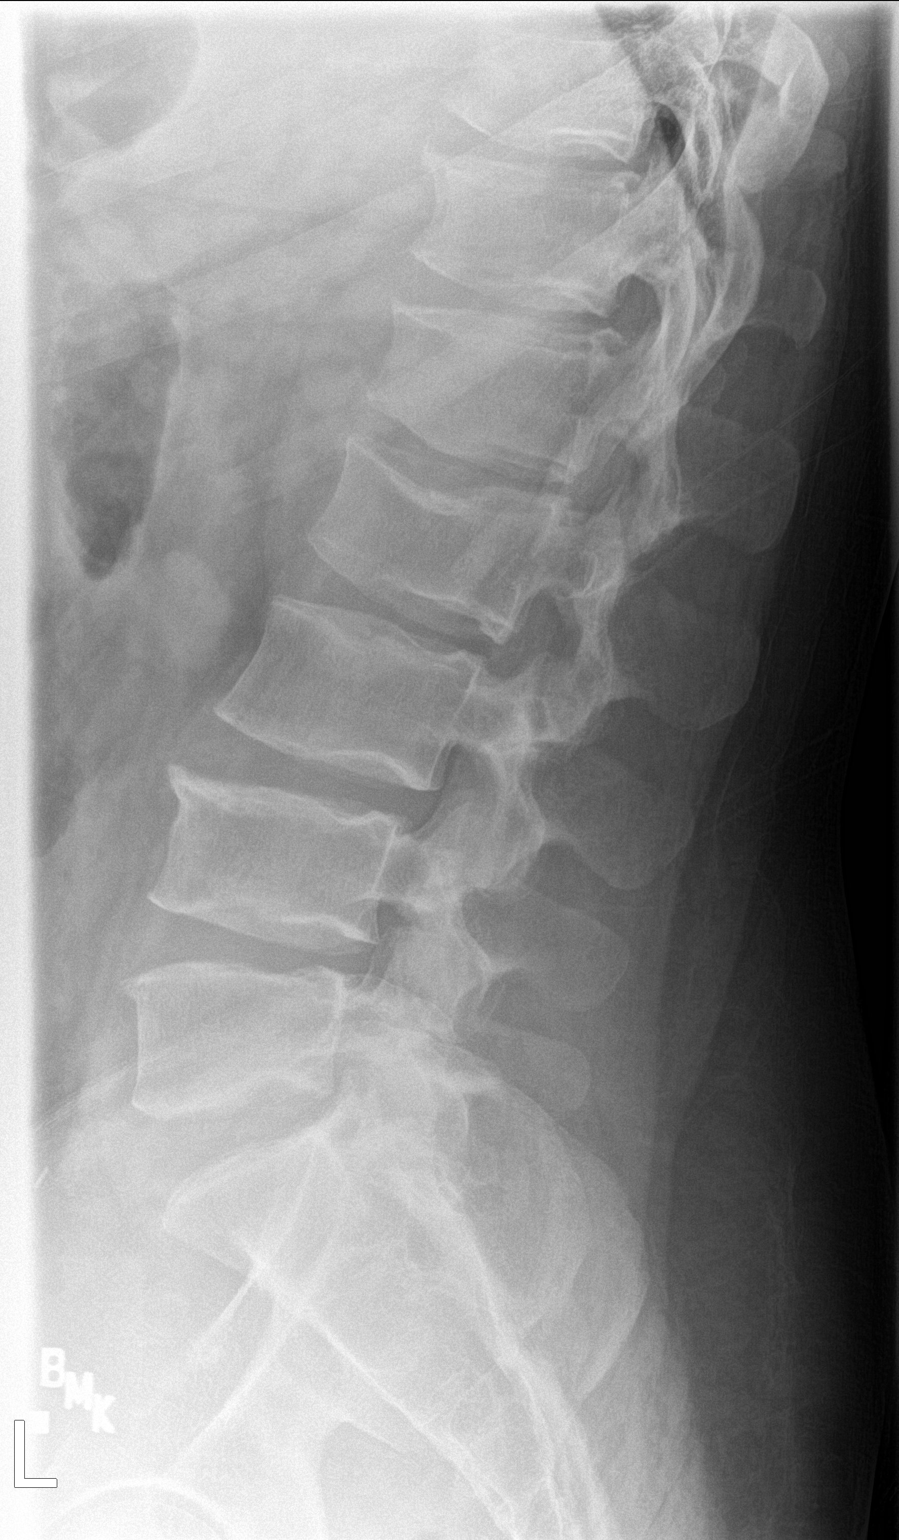
[im 3/3]
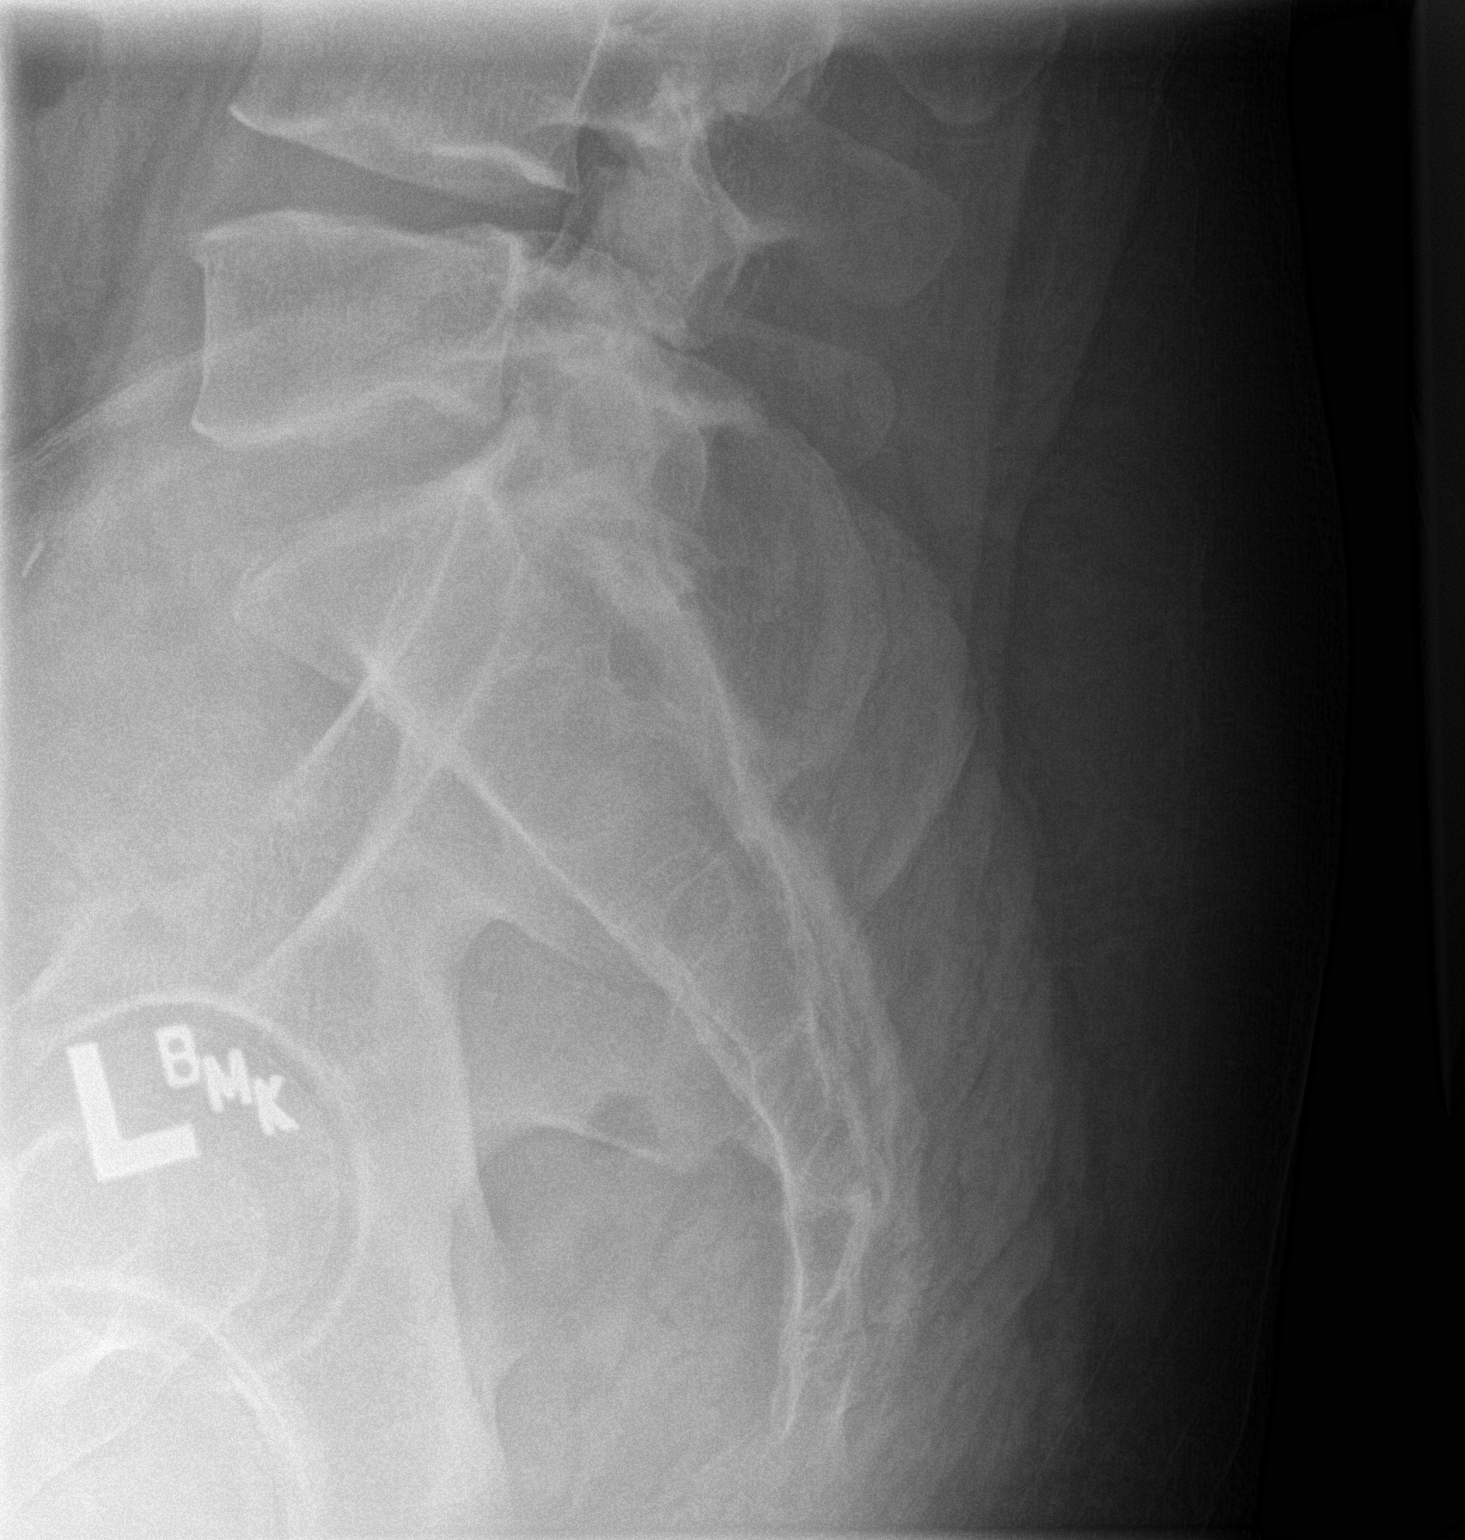

[3 of 3 positions shown; findings below may reference images not displayed]

FINDINGS: There is no acute fracture or subluxation of the lumbar spine. There
are multilevel degenerative changes with multilevel mild loss of
vertebral body height and endplate irregularity which appear
chronic. The visualized transverse and spinous processes appear
intact. The soft tissues appear unremarkable. Multiple surgical
clips noted in the right lower quadrant.
IMPRESSION: No acute fracture or subluxation.
